# Patient Record
Sex: Male | Born: 1975 | Race: Black or African American | Hispanic: No | Smoking: Never smoker
Health system: Southern US, Community
[De-identification: ages and names within clinical notes are randomized; demographics above are authoritative.]

---

## 2000-03-18 ENCOUNTER — Emergency Department (HOSPITAL_COMMUNITY): Admission: EM | Admit: 2000-03-18 | Discharge: 2000-03-18 | Payer: Self-pay | Admitting: Emergency Medicine

## 2000-11-03 ENCOUNTER — Emergency Department (HOSPITAL_COMMUNITY): Admission: EM | Admit: 2000-11-03 | Discharge: 2000-11-03 | Payer: Self-pay

## 2001-02-10 ENCOUNTER — Emergency Department (HOSPITAL_COMMUNITY): Admission: EM | Admit: 2001-02-10 | Discharge: 2001-02-10 | Payer: Self-pay

## 2001-09-22 ENCOUNTER — Emergency Department (HOSPITAL_COMMUNITY): Admission: EM | Admit: 2001-09-22 | Discharge: 2001-09-22 | Payer: Self-pay | Admitting: Emergency Medicine

## 2002-03-09 ENCOUNTER — Emergency Department (HOSPITAL_COMMUNITY): Admission: EM | Admit: 2002-03-09 | Discharge: 2002-03-09 | Payer: Self-pay | Admitting: Emergency Medicine

## 2003-09-27 ENCOUNTER — Emergency Department (HOSPITAL_COMMUNITY): Admission: EM | Admit: 2003-09-27 | Discharge: 2003-09-27 | Payer: Self-pay | Admitting: Emergency Medicine

## 2003-10-22 ENCOUNTER — Emergency Department (HOSPITAL_COMMUNITY): Admission: EM | Admit: 2003-10-22 | Discharge: 2003-10-22 | Payer: Self-pay | Admitting: Emergency Medicine

## 2003-11-24 ENCOUNTER — Emergency Department (HOSPITAL_COMMUNITY): Admission: EM | Admit: 2003-11-24 | Discharge: 2003-11-24 | Payer: Self-pay | Admitting: Emergency Medicine

## 2004-05-12 ENCOUNTER — Emergency Department (HOSPITAL_COMMUNITY): Admission: EM | Admit: 2004-05-12 | Discharge: 2004-05-12 | Payer: Self-pay | Admitting: Emergency Medicine

## 2004-07-21 ENCOUNTER — Emergency Department (HOSPITAL_COMMUNITY): Admission: EM | Admit: 2004-07-21 | Discharge: 2004-07-22 | Payer: Self-pay | Admitting: Emergency Medicine

## 2006-12-03 ENCOUNTER — Emergency Department (HOSPITAL_COMMUNITY): Admission: EM | Admit: 2006-12-03 | Discharge: 2006-12-03 | Payer: Self-pay | Admitting: Emergency Medicine

## 2017-02-08 ENCOUNTER — Emergency Department (HOSPITAL_COMMUNITY)
Admission: EM | Admit: 2017-02-08 | Discharge: 2017-02-08 | Disposition: A | Discharge status: Home / Self Care | Payer: Self-pay | Attending: Emergency Medicine | Admitting: Emergency Medicine

## 2017-02-08 ENCOUNTER — Encounter (HOSPITAL_COMMUNITY): Payer: Self-pay

## 2017-02-08 DIAGNOSIS — K047 Periapical abscess without sinus: Secondary | ICD-10-CM | POA: Insufficient documentation

## 2017-02-08 MED ORDER — PENICILLIN V POTASSIUM 500 MG PO TABS: 500.0000 mg | ORAL_TABLET | Freq: Three times a day (TID) | ORAL | 0 refills | Status: AC

## 2017-02-08 MED ORDER — PENICILLIN V POTASSIUM 500 MG PO TABS
500.0000 mg | ORAL_TABLET | Freq: Once | ORAL | Status: AC
  Administered 2017-02-08: 500 mg via ORAL
  Filled 2017-02-08: qty 1

## 2017-02-08 NOTE — ED Provider Notes (Signed)
[facility]-EMERGENCY DEPT Provider Note   CSN: [id number] Arrival date & time: 02/08/17  0301     History   Chief Complaint Chief Complaint  Patient presents with  . Dental Pain    HPI Jeffery Hill is a 41 y.o. male.  Patient presents with complaint of right sided dental pain and swelling ongoing for the past 1 week. He states that he has an appointment with his dentist in 5 days. He thinks that it's getting infected due to worsening swelling and pain. Has been taking over-the-counter medications without relief. He has noticed that he is not able to open his mouth as well. Pain radiates to the ear. No fevers, difficulty breathing or swallowing. The onset of this condition was acute. The course is worsening. Aggravating factors: none. Alleviating factors: none.        History reviewed. No pertinent past medical history.  There are no active problems to display for this patient.   History reviewed. No pertinent surgical history.     Home Medications    Prior to Admission medications   Medication Sig Start Date End Date Taking? Authorizing Provider  penicillin v potassium (VEETID) 500 MG tablet Take 1 tablet (500 mg total) by mouth 3 (three) times daily. 02/08/17   Renne Crigler, PA-C    Family History History reviewed. No pertinent family history.  Social History Social History  Substance Use Topics  . Smoking status: Never Smoker  . Smokeless tobacco: Never Used  . Alcohol use No     Allergies   Patient has no allergy information on record.   Review of Systems Review of Systems  Constitutional: Negative for fever.  HENT: Positive for dental problem, ear pain and facial swelling. Negative for sore throat and trouble swallowing.   Respiratory: Negative for shortness of breath and stridor.   Musculoskeletal: Negative for neck pain.  Skin: Negative for color change.  Neurological: Negative for headaches.     Physical Exam Updated Vital Signs BP 134/83  (BP Location: Left Arm)   Pulse 60   Temp 98.9 F (37.2 C) (Oral)   Resp 18   Ht 6' (1.829 m)   Wt 107.4 kg (236 lb 12.8 oz)   SpO2 100%   BMI 32.12 kg/m   Physical Exam  Constitutional: He appears well-developed and well-nourished.  HENT:  Head: Normocephalic and atraumatic.  Right Ear: Tympanic membrane, external ear and ear canal normal.  Left Ear: Tympanic membrane, external ear and ear canal normal.  Nose: Nose normal.  Mouth/Throat: Uvula is midline, oropharynx is clear and moist and mucous membranes are normal. No trismus in the jaw. Abnormal dentition. Dental caries present. No dental abscesses or uvula swelling. No tonsillar abscesses.  Erythema and swelling noted without palpable abscess in the area of tooth #32. Mild trismus noted on exam.  Eyes: Pupils are equal, round, and reactive to light.  Neck: Normal range of motion. Neck supple.  No neck swelling or Lugwig's angina. Sublingual space is soft without firmness. Submental space soft without palpable abscess. Full range of motion of neck without discomfort.  Neurological: He is alert.  Skin: Skin is warm and dry.  Psychiatric: He has a normal mood and affect.  Nursing note and vitals reviewed.    ED Treatments / Results   Procedures Procedures (including critical care time)  Medications Ordered in ED Medications  penicillin v potassium (VEETID) tablet 500 mg (500 mg Oral Given 02/08/17 0646)     Initial Impression / Assessment and  Plan / ED Course  I have reviewed the triage vital signs and the nursing notes.  Pertinent labs & imaging results that were available during my care of the patient were reviewed by me and considered in my medical decision making (see chart for details).     7:05 AM Patient seen and examined. Medications ordered.   Vital signs reviewed and are as follows: BP 134/83 (BP Location: Left Arm)   Pulse 60   Temp 98.9 F (37.2 C) (Oral)   Resp 18   Ht 6' (1.829 m)   Wt 107.4 kg  (236 lb 12.8 oz)   SpO2 100%   BMI 32.12 kg/m   Patient counseled to take prescribed medications as directed, return with worsening facial or neck swelling, and to follow-up with their dentist as soon as possible.    Final Clinical Impressions(s) / ED Diagnoses   Final diagnoses:  Dental infection   Patient with toothache. No fever. Exam unconcerning for Ludwig's angina or other deep tissue infection in neck.   As there is gum swelling, erythema, and facial swelling, will treat with antibiotic and pain medicine. Urged patient to follow-up with dentist.    New Prescriptions New Prescriptions   PENICILLIN V POTASSIUM (VEETID) 500 MG TABLET    Take 1 tablet (500 mg total) by mouth 3 (three) times daily.     Renne Crigler, PA-C 02/08/17 1610    Paula Libra, MD 02/08/17 [phone]

## 2017-02-08 NOTE — Discharge Instructions (Signed)
Please read and follow all provided instructions.  Your diagnoses today include:  1. Dental infection     The exam and treatment you received today has been provided on an emergency basis only. This is not a substitute for complete medical or dental care.  Tests performed today include:  Vital signs. See below for your results today.   Medications prescribed:   Penicillin - antibiotic  You have been prescribed an antibiotic medicine: take the entire course of medicine even if you are feeling better. Stopping early can cause the antibiotic not to work.  Take any prescribed medications only as directed.  Home care instructions:  Follow any educational materials contained in this packet.  Follow-up instructions: Please follow-up with your dentist as planned for further evaluation of your symptoms.   Return instructions:   Please return to the Emergency Department if you experience worsening symptoms.  Please return if you develop a fever, you develop more swelling in your face or neck, you have trouble breathing or swallowing food.  Please return if you have any other emergent concerns.  Additional Information:  Your vital signs today were: BP 134/83 (BP Location: Left Arm)    Pulse 60    Temp 98.9 F (37.2 C) (Oral)    Resp 18    Ht 6' (1.829 m)    Wt 107.4 kg (236 lb 12.8 oz)    SpO2 100%    BMI 32.12 kg/m  If your blood pressure (BP) was elevated above 135/85 this visit, please have this repeated by your doctor within one month. --------------

## 2017-02-08 NOTE — ED Triage Notes (Signed)
Pt complains of dental pain for one week, he has an appt to get the tooth pulled but he feels like its getting infected

## 2017-02-08 NOTE — ED Notes (Signed)
Pt went to the dentist yesterday for dental pain, and has an appointment for next Wednesday the 26th to get the tooth pulled. He arrives tonight because he didn't receive any pain medication or antibiotics and his pain is getting worse.

## 2019-11-13 ENCOUNTER — Ambulatory Visit (INDEPENDENT_AMBULATORY_CARE_PROVIDER_SITE_OTHER): Payer: Self-pay | Admitting: Primary Care

## 2019-11-27 ENCOUNTER — Ambulatory Visit (INDEPENDENT_AMBULATORY_CARE_PROVIDER_SITE_OTHER): Payer: Self-pay | Admitting: Primary Care

## 2020-04-12 ENCOUNTER — Ambulatory Visit (INDEPENDENT_AMBULATORY_CARE_PROVIDER_SITE_OTHER): Payer: Self-pay | Admitting: Primary Care

## 2020-08-26 ENCOUNTER — Emergency Department (HOSPITAL_COMMUNITY): Payer: Worker's Compensation

## 2020-08-26 ENCOUNTER — Other Ambulatory Visit: Payer: Self-pay

## 2020-08-26 ENCOUNTER — Emergency Department (HOSPITAL_COMMUNITY)
Admission: EM | Admit: 2020-08-26 | Discharge: 2020-08-26 | Disposition: A | Discharge status: Home / Self Care | Payer: Worker's Compensation | Attending: Emergency Medicine | Admitting: Emergency Medicine

## 2020-08-26 DIAGNOSIS — S93402A Sprain of unspecified ligament of left ankle, initial encounter: Secondary | ICD-10-CM | POA: Diagnosis not present

## 2020-08-26 DIAGNOSIS — X501XXA Overexertion from prolonged static or awkward postures, initial encounter: Secondary | ICD-10-CM | POA: Insufficient documentation

## 2020-08-26 DIAGNOSIS — Y990 Civilian activity done for income or pay: Secondary | ICD-10-CM | POA: Insufficient documentation

## 2020-08-26 DIAGNOSIS — Y92812 Truck as the place of occurrence of the external cause: Secondary | ICD-10-CM | POA: Diagnosis not present

## 2020-08-26 DIAGNOSIS — S99912A Unspecified injury of left ankle, initial encounter: Secondary | ICD-10-CM | POA: Diagnosis present

## 2020-08-26 MED ORDER — IBUPROFEN 400 MG PO TABS
600.0000 mg | ORAL_TABLET | Freq: Once | ORAL | Status: AC
  Administered 2020-08-26: 600 mg via ORAL
  Filled 2020-08-26: qty 1

## 2020-08-26 NOTE — ED Notes (Signed)
Patient verbalizes understanding of discharge instructions. Opportunity for questioning and answers were provided. Armband removed by staff, pt discharged from ED ambulatory with crutches. ° °

## 2020-08-26 NOTE — ED Provider Notes (Signed)
MSE was initiated and I personally evaluated the patient and placed orders (if any) at  6:36 PM on August 26, 2020.  The patient appears stable so that the remainder of the MSE may be completed by another provider.  Vitals:   08/26/20 1828  BP: 136/84  Pulse: 77  Resp: 18  Temp: 99.7 F (37.6 C)  SpO2: 99%      Carroll Sage, PA-C 08/26/20 1836    Sabas Sous, MD 08/27/20 [phone]

## 2020-08-26 NOTE — Progress Notes (Signed)
Orthopedic Tech Progress Note Patient Details:  Jeffery Hill [birth year] [id number]  Ortho Devices Type of Ortho Device: CAM walker,Crutches Ortho Device/Splint Location: LLE Ortho Device/Splint Interventions: Ordered,Application   Post Interventions Patient Tolerated: Well Instructions Provided: Adjustment of device   Maurene Capes 08/26/2020, 10:41 PM

## 2020-08-26 NOTE — ED Notes (Signed)
Ortho tech called to come help with CAM boot and crutches

## 2020-08-26 NOTE — ED Provider Notes (Signed)
[facility] EMERGENCY DEPARTMENT Provider Note   CSN: [id number] Arrival date & time: 08/26/20  1816     History Chief Complaint  Patient presents with  . Ankle Pain    Jeffery Hill is a 45 y.o. male.  HPI 45 year old male with no significant medical history presents to the ER with left ankle pain.  Patient states that he twisted his ankle getting out of the truck at work at around lunchtime today.  Has not been able to bear weight since.  Has noted some swelling to the ankle.  States he feels some numbness due to the swelling.  No tingling.  No open wounds.    No past medical history on file.  There are no problems to display for this patient.   No past surgical history on file.     No family history on file.  Social History   Tobacco Use  . Smoking status: Never Smoker  . Smokeless tobacco: Never Used  Substance Use Topics  . Alcohol use: No  . Drug use: No    Home Medications Prior to Admission medications   Medication Sig Start Date End Date Taking? Authorizing Provider  penicillin v potassium (VEETID) 500 MG tablet Take 1 tablet (500 mg total) by mouth 3 (three) times daily. 02/08/17   Renne Crigler, PA-C    Allergies    Patient has no known allergies.  Review of Systems   Review of Systems  Musculoskeletal: Positive for arthralgias, gait problem and joint swelling.  Neurological: Positive for numbness. Negative for weakness.    Physical Exam Updated Vital Signs BP 136/84 (BP Location: Left Arm)   Pulse 77   Temp 99.7 F (37.6 C)   Resp 18   SpO2 99%   Physical Exam Vitals and nursing note reviewed.  Constitutional:      Appearance: He is well-developed.  HENT:     Head: Normocephalic and atraumatic.  Eyes:     Conjunctiva/sclera: Conjunctivae normal.  Cardiovascular:     Rate and Rhythm: Normal rate and regular rhythm.     Heart sounds: No murmur heard.   Pulmonary:     Effort: Pulmonary effort is normal. No  respiratory distress.     Breath sounds: Normal breath sounds.  Abdominal:     Palpations: Abdomen is soft.     Tenderness: There is no abdominal tenderness.  Musculoskeletal:        General: Swelling, tenderness and signs of injury present. No deformity.     Cervical back: Neck supple.     Right lower leg: No edema.     Left lower leg: No edema.     Comments: Left ankle with moderate swelling.  No overlying wounds.  Slightly decreased range of motion with dorsiflexion and plantarflexion of the leg ankle, however he is able to perform this.  Sensations are intact.  2+ DP pulses.  Moving all 5 toes without difficulty.  Unable to bear weight secondary to pain.  No visible deformity.  Skin:    General: Skin is warm and dry.  Neurological:     Mental Status: He is alert.     ED Results / Procedures / Treatments   Labs (all labs ordered are listed, but only abnormal results are displayed) Labs Reviewed - No data to display  EKG None  Radiology DG Ankle Complete Left  Result Date: 08/26/2020 CLINICAL DATA:  Fall, ankle pain EXAM: LEFT ANKLE COMPLETE - 3+ VIEW COMPARISON:  None. FINDINGS: Lateral  soft tissue swelling. No acute bony abnormality. Specifically, no fracture, subluxation, or dislocation. IMPRESSION: No acute bony abnormality. Electronically Signed   By: Charlett Nose M.D.   On: 08/26/2020 19:12    Procedures Procedures   Medications Ordered in ED Medications  ibuprofen (ADVIL) tablet 600 mg (has no administration in time range)    ED Course  I have reviewed the triage vital signs and the nursing notes.  Pertinent labs & imaging results that were available during my care of the patient were reviewed by me and considered in my medical decision making (see chart for details).    MDM Rules/Calculators/A&P                         45 year old male with left ankle pain after twisting it when while coming out of a truck this morning.  He does have visible swelling to the  left ankle, motion mildly decreased secondary to pain, no evidence of open fractures or dislocations.  Neurovascularly intact.  Plain films without evidence of fractures.  We will provide a cam walker, crutches, encouraged RICE protocol, Tylenol and ibuprofen for pain.  Will provide orthopedic follow-up.  Return precautions discussed.  He voiced understanding is agreeable.  Final Clinical Impression(s) / ED Diagnoses Final diagnoses:  Sprain of left ankle, unspecified ligament, initial encounter    Rx / DC Orders ED Discharge Orders    None       Leone Brand 08/26/20 1947    Margarita Grizzle, MD 08/28/20 [phone]

## 2020-08-26 NOTE — ED Triage Notes (Signed)
Pt from work for eval of L ankle injury when getting out of his truck, twisting L ankle. Pain to same. Denies other injury.

## 2020-08-26 NOTE — Discharge Instructions (Addendum)
Rest - please stay off ankle as much as possible Ice - ice for 20 minutes at a time, several times a day Compression - wear brace to provide support Elevate - elevate ankle above level of heart Ibuprofen - take with food. Take up to 3-4 times daily  Please wear the boot and use the crutches as needed.  Make sure to follow-up with orthopedics.  Return to the ER for any new or worsening symptoms.

## 2022-05-21 IMAGING — CR DG ANKLE COMPLETE 3+V*L*
3 series · 3 of 3 positions shown · non-contrast
Comparison: None.

CLINICAL DATA: Fall, ankle pain

EXAM:
LEFT ANKLE COMPLETE - 3+ VIEW

[ankle ap]
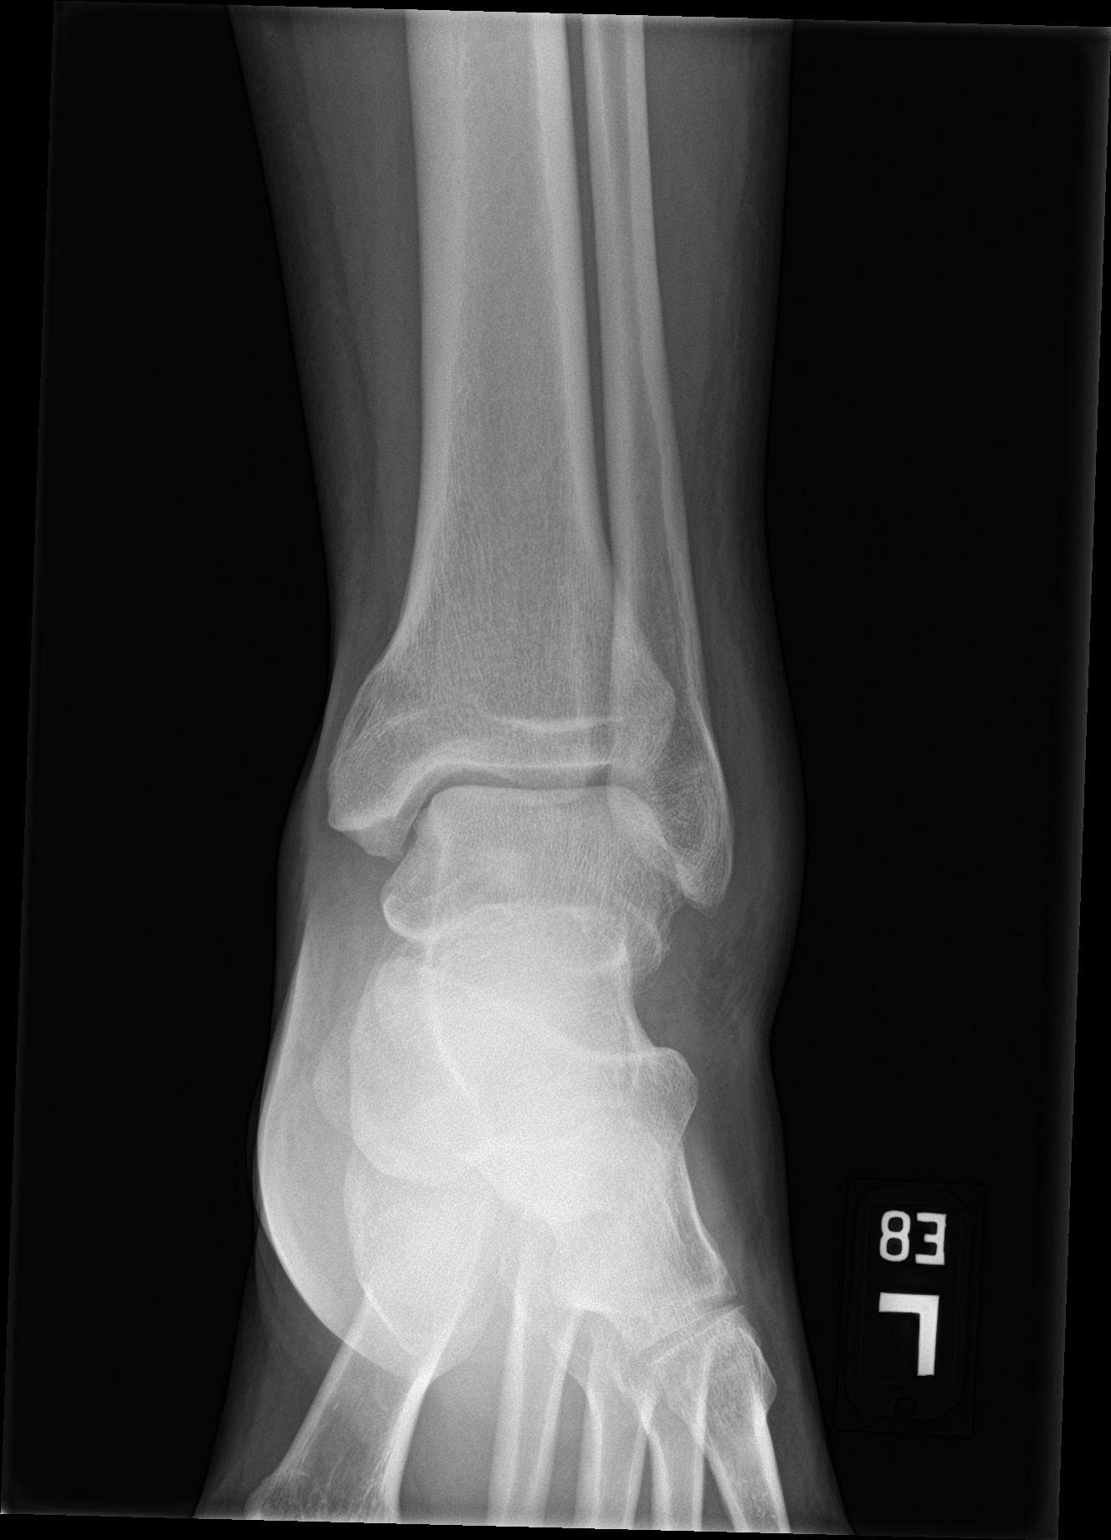

[ankle obl]
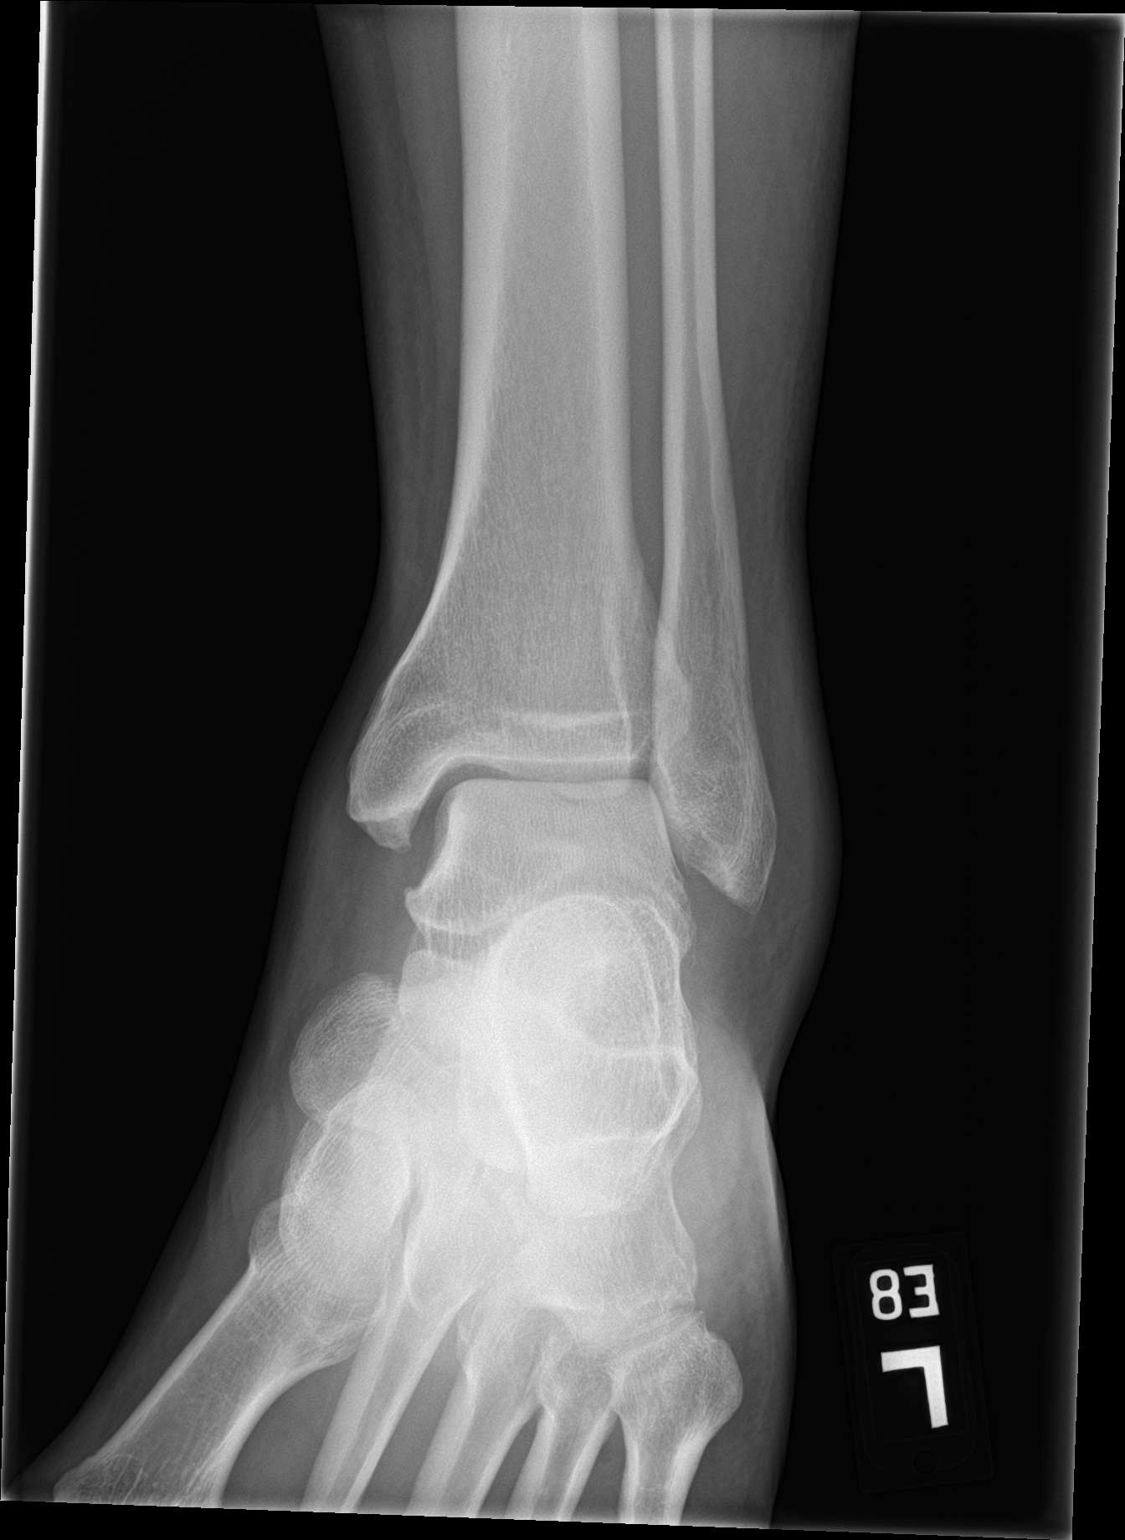

[ankle lat]
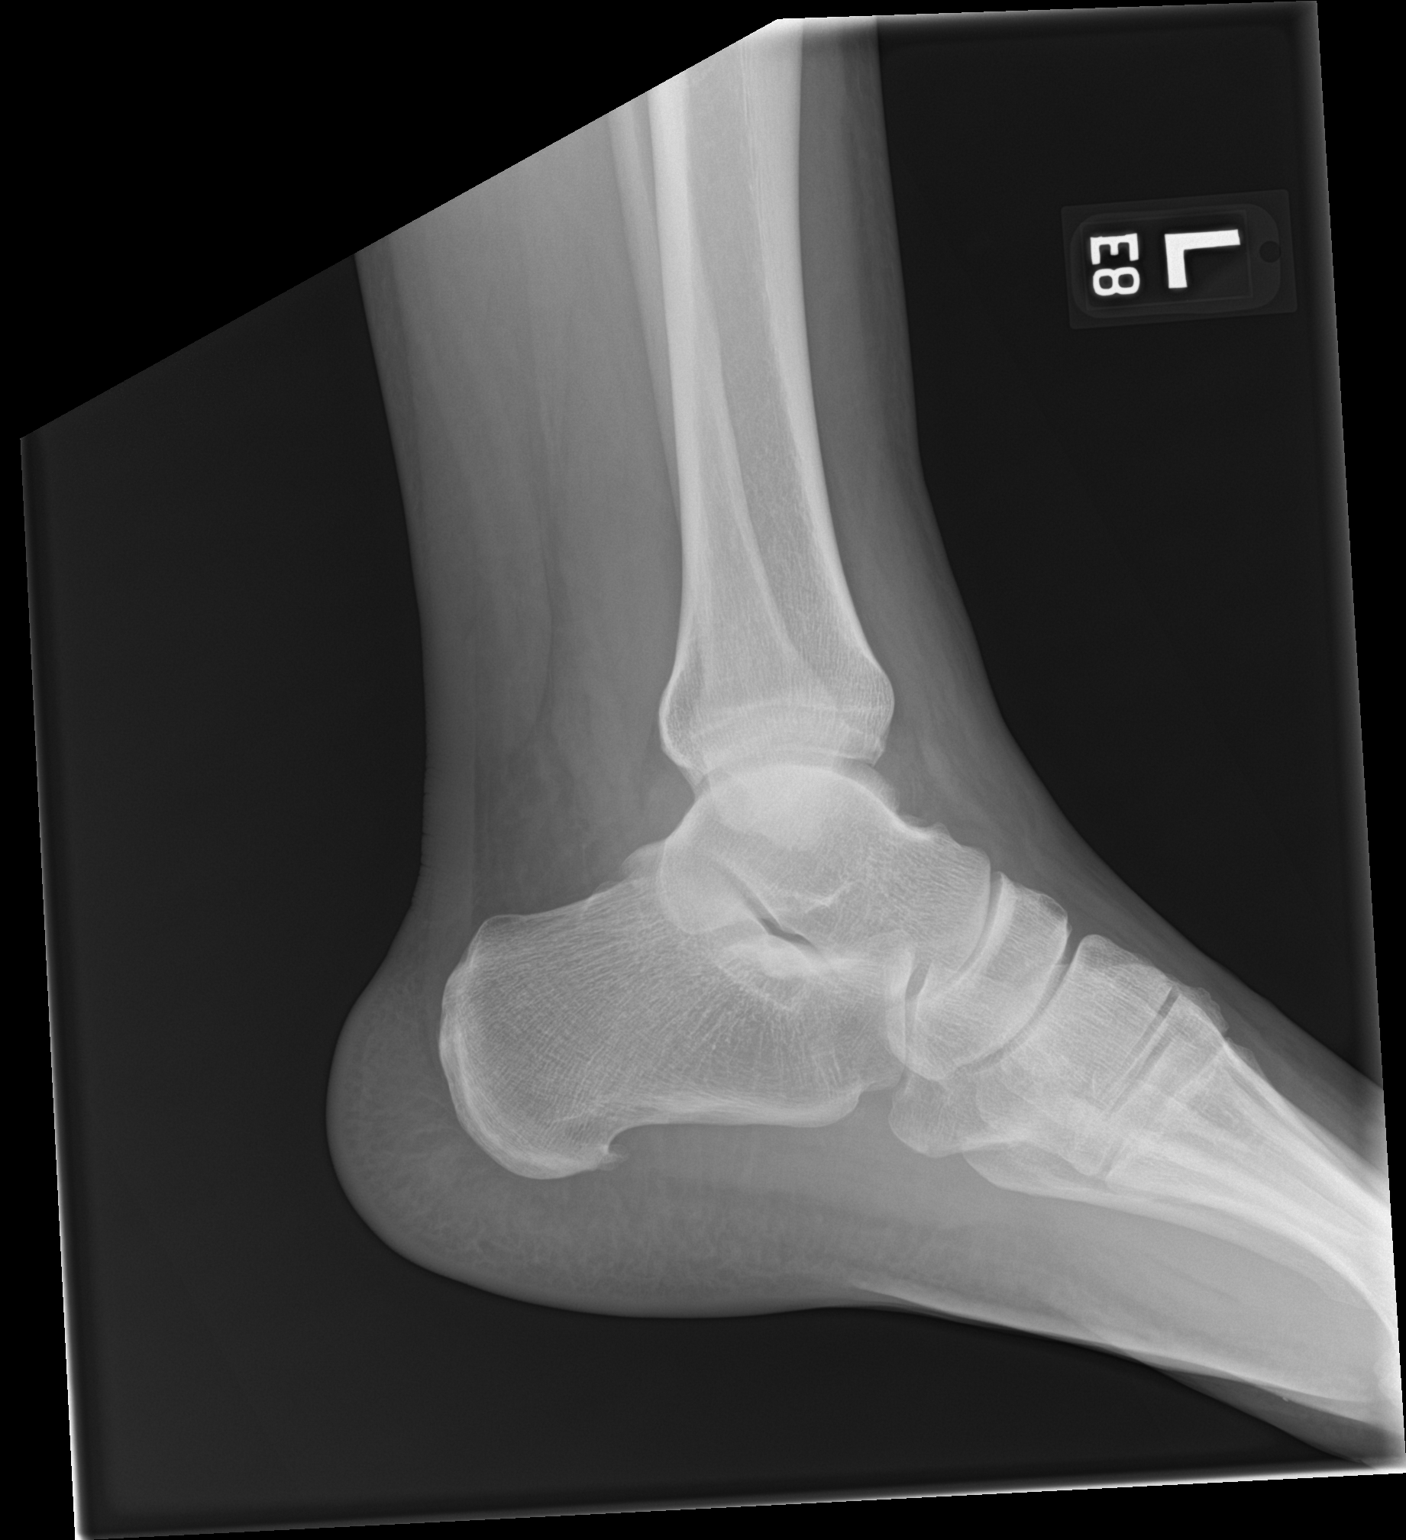

[3 of 3 positions shown; findings below may reference images not displayed]

FINDINGS: Lateral soft tissue swelling. No acute bony abnormality.
Specifically, no fracture, subluxation, or dislocation.
IMPRESSION: No acute bony abnormality.

## 2023-04-02 ENCOUNTER — Ambulatory Visit: Payer: Managed Care, Other (non HMO) | Admitting: Family
